# Patient Record
Sex: Male | Born: 1960 | Race: White | Hispanic: No | Marital: Married | Smoking: Never smoker
Health system: Southern US, Community
[De-identification: ages and names within clinical notes are randomized; demographics above are authoritative.]

## PROBLEM LIST (undated history)

## (undated) DIAGNOSIS — K2270 Barrett's esophagus without dysplasia: Secondary | ICD-10-CM

## (undated) DIAGNOSIS — K449 Diaphragmatic hernia without obstruction or gangrene: Secondary | ICD-10-CM

## (undated) DIAGNOSIS — E785 Hyperlipidemia, unspecified: Secondary | ICD-10-CM

## (undated) DIAGNOSIS — K219 Gastro-esophageal reflux disease without esophagitis: Secondary | ICD-10-CM

## (undated) DIAGNOSIS — K222 Esophageal obstruction: Secondary | ICD-10-CM

## (undated) DIAGNOSIS — I10 Essential (primary) hypertension: Secondary | ICD-10-CM

## (undated) DIAGNOSIS — I2510 Atherosclerotic heart disease of native coronary artery without angina pectoris: Secondary | ICD-10-CM

## (undated) DIAGNOSIS — T7840XA Allergy, unspecified, initial encounter: Secondary | ICD-10-CM

## (undated) DIAGNOSIS — K5790 Diverticulosis of intestine, part unspecified, without perforation or abscess without bleeding: Secondary | ICD-10-CM

## (undated) HISTORY — DX: Esophageal obstruction: K22.2

## (undated) HISTORY — DX: Diaphragmatic hernia without obstruction or gangrene: K44.9

## (undated) HISTORY — DX: Atherosclerotic heart disease of native coronary artery without angina pectoris: I25.10

## (undated) HISTORY — PX: COLONOSCOPY: SHX174

## (undated) HISTORY — PX: OTHER SURGICAL HISTORY: SHX169

## (undated) HISTORY — DX: Diverticulosis of intestine, part unspecified, without perforation or abscess without bleeding: K57.90

## (undated) HISTORY — PX: UPPER GASTROINTESTINAL ENDOSCOPY: SHX188

## (undated) HISTORY — DX: Gastro-esophageal reflux disease without esophagitis: K21.9

## (undated) HISTORY — DX: Hyperlipidemia, unspecified: E78.5

## (undated) HISTORY — PX: INGUINAL HERNIA REPAIR: SUR1180

## (undated) HISTORY — DX: Allergy, unspecified, initial encounter: T78.40XA

## (undated) HISTORY — DX: Essential (primary) hypertension: I10

## (undated) HISTORY — DX: Barrett's esophagus without dysplasia: K22.70

---

## 2000-03-18 ENCOUNTER — Encounter: Admission: RE | Admit: 2000-03-18 | Discharge: 2000-03-18 | Payer: Self-pay | Admitting: Family Medicine

## 2000-03-18 ENCOUNTER — Encounter (INDEPENDENT_AMBULATORY_CARE_PROVIDER_SITE_OTHER): Payer: Self-pay | Admitting: *Deleted

## 2000-03-18 ENCOUNTER — Encounter: Payer: Self-pay | Admitting: Family Medicine

## 2000-03-22 ENCOUNTER — Encounter (INDEPENDENT_AMBULATORY_CARE_PROVIDER_SITE_OTHER): Payer: Self-pay | Admitting: Specialist

## 2000-03-22 ENCOUNTER — Ambulatory Visit (HOSPITAL_COMMUNITY): Admission: RE | Admit: 2000-03-22 | Discharge: 2000-03-22 | Payer: Self-pay | Admitting: Internal Medicine

## 2000-05-11 ENCOUNTER — Encounter: Payer: Self-pay | Admitting: Internal Medicine

## 2000-05-11 ENCOUNTER — Ambulatory Visit (HOSPITAL_COMMUNITY): Admission: RE | Admit: 2000-05-11 | Discharge: 2000-05-11 | Payer: Self-pay | Admitting: Internal Medicine

## 2001-07-07 ENCOUNTER — Encounter (INDEPENDENT_AMBULATORY_CARE_PROVIDER_SITE_OTHER): Payer: Self-pay | Admitting: *Deleted

## 2001-07-07 ENCOUNTER — Ambulatory Visit (HOSPITAL_BASED_OUTPATIENT_CLINIC_OR_DEPARTMENT_OTHER): Admission: RE | Admit: 2001-07-07 | Discharge: 2001-07-07 | Payer: Self-pay | Admitting: Surgery

## 2001-10-25 ENCOUNTER — Encounter: Payer: Self-pay | Admitting: Internal Medicine

## 2002-02-27 ENCOUNTER — Ambulatory Visit (HOSPITAL_COMMUNITY): Admission: RE | Admit: 2002-02-27 | Discharge: 2002-02-27 | Payer: Self-pay | Admitting: Internal Medicine

## 2002-10-19 ENCOUNTER — Ambulatory Visit (HOSPITAL_BASED_OUTPATIENT_CLINIC_OR_DEPARTMENT_OTHER): Admission: RE | Admit: 2002-10-19 | Discharge: 2002-10-19 | Payer: Self-pay | Admitting: General Surgery

## 2002-10-19 ENCOUNTER — Encounter (INDEPENDENT_AMBULATORY_CARE_PROVIDER_SITE_OTHER): Payer: Self-pay | Admitting: Specialist

## 2002-10-26 ENCOUNTER — Encounter (INDEPENDENT_AMBULATORY_CARE_PROVIDER_SITE_OTHER): Payer: Self-pay | Admitting: *Deleted

## 2003-05-16 ENCOUNTER — Encounter: Admission: RE | Admit: 2003-05-16 | Discharge: 2003-05-16 | Payer: Self-pay | Admitting: General Surgery

## 2003-09-10 ENCOUNTER — Ambulatory Visit (HOSPITAL_COMMUNITY): Admission: RE | Admit: 2003-09-10 | Discharge: 2003-09-10 | Payer: Self-pay | Admitting: Internal Medicine

## 2003-09-10 ENCOUNTER — Encounter (INDEPENDENT_AMBULATORY_CARE_PROVIDER_SITE_OTHER): Payer: Self-pay | Admitting: Specialist

## 2005-11-30 ENCOUNTER — Ambulatory Visit: Payer: Self-pay | Admitting: Internal Medicine

## 2006-01-21 ENCOUNTER — Encounter: Payer: Self-pay | Admitting: Internal Medicine

## 2006-01-21 ENCOUNTER — Ambulatory Visit: Payer: Self-pay | Admitting: Internal Medicine

## 2007-09-19 ENCOUNTER — Encounter: Admission: RE | Admit: 2007-09-19 | Discharge: 2007-09-19 | Payer: Self-pay | Admitting: Family Medicine

## 2008-06-03 ENCOUNTER — Encounter: Admission: RE | Admit: 2008-06-03 | Discharge: 2008-06-03 | Payer: Self-pay | Admitting: Unknown Physician Specialty

## 2008-10-14 ENCOUNTER — Ambulatory Visit: Payer: Self-pay | Admitting: Internal Medicine

## 2008-10-25 ENCOUNTER — Encounter: Payer: Self-pay | Admitting: Internal Medicine

## 2008-10-25 ENCOUNTER — Ambulatory Visit: Payer: Self-pay | Admitting: Internal Medicine

## 2008-10-28 ENCOUNTER — Encounter: Payer: Self-pay | Admitting: Internal Medicine

## 2009-09-16 ENCOUNTER — Telehealth: Payer: Self-pay | Admitting: Internal Medicine

## 2009-09-18 DIAGNOSIS — K219 Gastro-esophageal reflux disease without esophagitis: Secondary | ICD-10-CM

## 2009-09-18 DIAGNOSIS — Z8719 Personal history of other diseases of the digestive system: Secondary | ICD-10-CM

## 2009-09-18 DIAGNOSIS — K449 Diaphragmatic hernia without obstruction or gangrene: Secondary | ICD-10-CM | POA: Insufficient documentation

## 2009-09-18 DIAGNOSIS — K222 Esophageal obstruction: Secondary | ICD-10-CM

## 2009-09-23 ENCOUNTER — Telehealth: Payer: Self-pay | Admitting: Internal Medicine

## 2009-09-23 ENCOUNTER — Ambulatory Visit: Payer: Self-pay | Admitting: Internal Medicine

## 2009-09-23 DIAGNOSIS — E785 Hyperlipidemia, unspecified: Secondary | ICD-10-CM

## 2009-09-23 DIAGNOSIS — I10 Essential (primary) hypertension: Secondary | ICD-10-CM | POA: Insufficient documentation

## 2009-09-23 DIAGNOSIS — R198 Other specified symptoms and signs involving the digestive system and abdomen: Secondary | ICD-10-CM

## 2009-10-06 ENCOUNTER — Ambulatory Visit: Payer: Self-pay | Admitting: Internal Medicine

## 2009-11-19 ENCOUNTER — Telehealth: Payer: Self-pay | Admitting: Internal Medicine

## 2010-08-04 NOTE — Progress Notes (Signed)
Summary: medication  Phone Note Call from Patient Call back at Home Phone [phone]   Caller: Patient Call For: Dr. Juanda Chance Reason for Call: Refill Medication Summary of Call: Pt went to pharmacy and needs his Cipro and Bentyl called in Initial call taken by: Karna Christmas,  September 23, 2009 12:34 PM    New/Updated Medications: CIPRO 250 MG TABS (CIPROFLOXACIN HCL) Take 1 tablet by mouth two times a day x 5 days BENTYL 10 MG CAPS (DICYCLOMINE HCL) Take 1 tablet by mouth two times a day Prescriptions: BENTYL 10 MG CAPS (DICYCLOMINE HCL) Take 1 tablet by mouth two times a day  #30 x 0   Entered by:   Hortense Ramal CMA (AAMA)   Authorized by:   Hart Carwin MD   Signed by:   Hortense Ramal CMA (AAMA) on 09/23/2009   Method used:   Electronically to        [facility] (retail)       [address]       [place], [place]  [address]       Ph: [phone]       Fax: [phone]   RxID:   [id number] CIPRO 250 MG TABS (CIPROFLOXACIN HCL) Take 1 tablet by mouth two times a day x 5 days  #10 x 0   Entered by:   Hortense Ramal CMA (AAMA)   Authorized by:   Hart Carwin MD   Signed by:   Hortense Ramal CMA (AAMA) on 09/23/2009   Method used:   Electronically to        [facility] (retail)       [address]       [place], [place]  [address]       Ph: [phone]       Fax: [phone]   RxID:   [id number]

## 2010-08-04 NOTE — Progress Notes (Signed)
Summary: Triage  Phone Note Call from Patient Call back at Work Phone [phone]   Caller: Patient Call For: Dr. Juanda Chance Reason for Call: Talk to Nurse Summary of Call: pt is having alot of "gas" in the mornings and espicially after he has ate.  Initial call taken by: Karna Christmas,  September 16, 2009 9:41 AM  Follow-up for Phone Call        Pt. c/o 3 weeks of epigastric bloating/gas and grumbling, worse in the morning and after meals.  Takes Prevacid daily.  Denies constipation, diarrhea, rectal bleeding, black stools. Wants an appt. w/Dr.Clotiel Troop.  1) See Dr.Jerrik Housholder on 09-23-09 at 9am 2) Increase Prevacid to two times a day until OV 3) Gas-x,Phazyme, etc. as needed for gas & bloating. 4) Soft,bland diet. No spicy,greasy,fried foods.  5) Pt. instructed to call back as needed.  Follow-up by: Laureen Ochs LPN,  September 16, 2009 11:01 AM

## 2010-08-04 NOTE — Progress Notes (Signed)
Summary: Medication refill  Phone Note Call from Patient Call back at Home Phone [phone]   Caller: Patient Call For: Dr. Juanda Chance Reason for Call: Talk to Nurse Summary of Call: Needs refill of Bentyl.[employer]land [employer]land [employer]. Initial call taken by: Karna Christmas,  Nov 19, 2009 10:11 AM  Follow-up for Phone Call        prescription sent. Follow-up by: Lamona Curl CMA Duncan Dull),  Nov 19, 2009 12:11 PM    Prescriptions: BENTYL 10 MG CAPS (DICYCLOMINE HCL) Take 1 tablet by mouth two times a day  #60 x 3   Entered by:   Lamona Curl CMA (AAMA)   Authorized by:   Hart Carwin MD   Signed by:   Lamona Curl CMA (AAMA) on 11/19/2009   Method used:   Electronically to        [facility] (retail)       [address]       [place], [place]  [address]       Ph: [phone]       Fax: [phone]   RxID:   [id number]

## 2010-08-04 NOTE — Letter (Signed)
Summary: [facility] Instructions  [facility]  [address] [place], [place] [address]   Phone: [phone]  Fax: [phone]       KULLEN TOMASETTI    11/26/1960    MRN: [id number]       Procedure Day /Date: 10/06/09 Monday     Arrival Time: 1:00 pm     Procedure Time: 2:00 pm     Location of Procedure:                    _x _  [facility] (4th Floor)  PREPARATION FOR COLONOSCOPY WITH MIRALAX  Starting 5 days prior to your procedure 10/01/09 do not eat nuts, seeds, popcorn, corn, beans, peas,  salads, or any raw vegetables.  Do not take any fiber supplements (e.g. Metamucil, Citrucel, and Benefiber). ____________________________________________________________________________________________________   THE DAY BEFORE YOUR PROCEDURE         DATE: 10/05/09 DAY: Sunday  1   Drink clear liquids the entire day-NO SOLID FOOD  2   Do not drink anything colored red or purple.  Avoid juices with pulp.  No orange juice.  3   Drink at least 64 oz. (8 glasses) of fluid/clear liquids during the day to prevent dehydration and help the prep work efficiently.  CLEAR LIQUIDS INCLUDE: Water Jello Ice Popsicles Tea (sugar ok, no milk/cream) Powdered fruit flavored drinks Coffee (sugar ok, no milk/cream) Gatorade Juice: apple, white grape, white cranberry  Lemonade Clear bullion, consomm, broth Carbonated beverages (any kind) Strained chicken noodle soup Hard Candy  4   Mix the entire bottle of Miralax with 64 oz. of Gatorade/Powerade in the morning and put in the refrigerator to chill.  5   At 3:00 pm take 2 Dulcolax/Bisacodyl tablets.  6   At 4:30 pm take one Reglan/Metoclopramide tablet.  7  Starting at 5:00 pm drink one 8 oz glass of the Miralax mixture every 15-20 minutes until you have finished drinking the entire 64 oz.  You should finish drinking prep around 7:30 or 8:00 pm.  8   If you are nauseated, you may take the 2nd Reglan/Metoclopramide tablet at  6:30 pm.        9    At 8:00 pm take 2 more DULCOLAX/Bisacodyl tablets.        THE DAY OF YOUR PROCEDURE      DATE:  10/06/09 DAY: Monday  You may drink clear liquids until 12:00 pm  (2 HOURS BEFORE PROCEDURE).   MEDICATION INSTRUCTIONS  Unless otherwise instructed, you should take regular prescription medications with a small sip of water as early as possible the morning of your procedure.       OTHER INSTRUCTIONS  You will need a responsible adult at least 48 years of age to accompany you and drive you home.   This person must remain in the waiting room during your procedure.  Wear loose fitting clothing that is easily removed.  Leave jewelry and other valuables at home.  However, you may wish to bring a book to read or an iPod/MP3 player to listen to music as you wait for your procedure to start.  Remove all body piercing jewelry and leave at home.  Total time from sign-in until discharge is approximately 2-3 hours.  You should go home directly after your procedure and rest.  You can resume normal activities the day after your procedure.  The day of your procedure you should not:   Drive   Make legal  decisions   Operate machinery   Drink alcohol   Return to work  You will receive specific instructions about eating, activities and medications before you leave.   The above instructions have been reviewed and explained to me by  Hortense Ramal CMA Duncan Dull)  September 23, 2009 10:05 AM     I fully understand and can verbalize these instructions _____________________________ Date 09/23/09

## 2010-08-04 NOTE — Procedures (Signed)
Summary: Colonoscopy  Patient: [name] Note: All result statuses are Final unless otherwise noted.  Tests: (1) Colonoscopy (COL)   COL Colonoscopy           DONE     [facility]     [address]     [place], [place]  [address]           COLONOSCOPY PROCEDURE REPORT           PATIENT:  Gabriel Franklin, Gabriel Franklin  MR#:  [id number]     BIRTHDATE:  1961-07-03, 48 yrs. old  GENDER:  male     ENDOSCOPIST:  Hedwig Morton. Juanda Chance, MD     REF. BY:  Bradd Canary, M.D.     PROCEDURE DATE:  10/06/2009     PROCEDURE:  Colonoscopy 19[address]     ASA CLASS:  Class I     INDICATIONS:  mother with suspected colon cancer     Barrett's esophagus     MEDICATIONS:   Versed 10 mg, Fentanyl 100 mcg           DESCRIPTION OF PROCEDURE:   After the risks benefits and     alternatives of the procedure were thoroughly explained, informed     consent was obtained.  Digital rectal exam was performed and     revealed no rectal masses.   The LB PCF-H180AL [id number] endoscope     was introduced through the anus and advanced to the cecum, which     was identified by both the appendix and ileocecal valve, without     limitations.  The quality of the prep was good, using MoviPrep.     The instrument was then slowly withdrawn as the colon was fully     examined.     <<PROCEDUREIMAGES>>           FINDINGS:  Mild diverticulosis was found (see image1). several     shallow diverticuli  This was otherwise a normal examination of     the colon (see image2, image3, image4, image5, and image6).     Retroflexed views in the rectum revealed no abnormalities.    The     scope was then withdrawn from the patient and the procedure     completed.           COMPLICATIONS:  None     ENDOSCOPIC IMPRESSION:     1) Mild diverticulosis     2) Otherwise normal examination     RECOMMENDATIONS:     1) high fiber diet     REPEAT EXAM:  In 5 - 7 year(s) for.           ______________________________     Hedwig Morton. Juanda Chance, MD           CC:          n.     eSIGNED:   Hedwig Morton. Brodie at 10/06/2009 02:43 PM           Creta Levin, [id number]  Note: An exclamation mark (!) indicates a result that was not dispersed into the flowsheet. Document Creation Date: 10/06/2009 2:43 PM _______________________________________________________________________  (1) Order result status: Final Collection or observation date-time: 10/06/2009 14:35 Requested date-time:  Receipt date-time:  Reported date-time:  Referring Physician:   Ordering Physician: Lina Sar [phone]) Specimen Source:  Source: Launa Grill Order Number: [id number] Lab site:   Appended Document: Colonoscopy    Clinical Lists Changes  Observations: Added new observation of COLONNXTDUE: 10/2014 (10/06/2009  14:45)     

## 2010-08-04 NOTE — Assessment & Plan Note (Signed)
Summary: Gastroenterology  [place]  MR#:  [id number] Page #  [facility]   GASTROENTEROLOGY OFFICE NOTE  NAME:  Gabriel Franklin, Gabriel Franklin   OFFICE NO:  [id number]  DATE:  10/25/01  SUBJECTIVE:  The patient is a very nice 50 year old white male with a history of Barrett's esophagus, diagnosed on upper endoscopy initially in September 2001.  Subsequently in November 2001, he was dilated to 79 [place] with Savary dilator.  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  The patient denies currently any dysphagia, odynophagia or any difficulties with swallowing.  He had one episode of burning about three weeks ago.  He takes Prilosec 20 mg every morning.  His weight has been stable.  He is due for upper endoscopy in November of this year.  The patient comes in to discuss his Barrett's esophagus.  He was curious to know how often he needs to be endoscoped, what is the prognosis, incidence of cancer, and medical versus surgical therapy.  DISPOSITION:  Since he is doing very well at this point, he does not need to be endoscoped.  However, he is due for repeat endoscopy in November, which would be two years, then every two years thereafter.  I have advised him to stay on PPIs indefinitely and to take an extra one if he has breakthrough symptoms.  He is switching from Prilosec to Nexium 40 mg once a day to twice a day and was given a new prescription for this.  I advised him not to exceed three cups of coffee a day, to elevate the head of the bed at night and to follow anti-reflux measures.  I would not advise Nissen fundoplication because of the likelihood that the patient has a secondary dysmotility disorder of the esophagus due to a long mid esophageal stricture.     Hedwig Morton. Juanda Chance, M.D.  [id number] D:  10/25/01; T:  ; Job [id number]

## 2010-08-04 NOTE — Assessment & Plan Note (Signed)
Summary: EPIGASTRIC BLOATING/GAS, GETTING WORSE            Gabriel Franklin   History of Present Illness Visit Type: new patient  Primary GI MD: Lina Sar MD Primary Provider: Bradd Canary, MD Requesting Provider: n/a Chief Complaint: After meals pt states that he gets lower abd pain and bloating  History of Present Illness:   This is a 50 year old white male with a several week history of crampy abdominal pain bloating and flatulence. His bowel habits have changed from one bowel movement a day to somewhat looser and more frequent stools. He denies any change in his eating habits or his regular schedule. He has never had a colonoscopy. His mother passed away  4 years ago and apparently had an elevated CEA level but never underwent a colonoscopy. Patient was told that she may have colon cancer. He is on Prevacid 30 mg once a day for Barrett's esophagus. His last upper endoscopy in April 2010 did not show any evidence of Barrett's esophagus.   GI Review of Systems    Reports abdominal pain and  bloating.     Location of  Abdominal pain: lower abdomen.    Denies acid reflux, belching, chest pain, dysphagia with liquids, dysphagia with solids, heartburn, loss of appetite, nausea, vomiting, vomiting blood, weight loss, and  weight gain.      Reports hemorrhoids.     Denies anal fissure, black tarry stools, change in bowel habit, constipation, diarrhea, diverticulosis, fecal incontinence, heme positive stool, irritable bowel syndrome, jaundice, light color stool, liver problems, rectal bleeding, and  rectal pain.    Current Medications (verified): 1)  Micardis Hct 40-12.5 Mg Tabs (Telmisartan-Hctz) .... One Tablet By Mouth Once Daily 2)  Prevacid 30 Mg Cpdr (Lansoprazole) .... One Tablet By Mouth Once Daily 3)  Crestor 10 Mg Tabs (Rosuvastatin Calcium) .... One Tablet By Mouth Once Daily 4)  Proctofoam Hc 1-1 % Foam (Hydrocortisone Ace-Pramoxine) .... As Directed  Allergies (verified): 1)  ! Pcn 2)  !  Darvon  Past History:  Past Medical History: Current Problems:  HYPERTENSION (ICD-401.9) HYPERLIPIDEMIA (ICD-272.4) GERD (ICD-530.81) HIATAL HERNIA (ICD-553.3) Hx of ESOPHAGEAL STRICTURE (ICD-530.3) BARRETT'S ESOPHAGUS, HX OF (ICD-V12.79)  Past Surgical History: Reviewed history from 09/18/2009 and no changes required. Bilateral Inguinal Hernia Repair  Family History: Family History of Prostate Cancer: Father  Family History of Colon Cancer: ? Mother   Social History: Occupation: Charity fundraiser Married 2 childern Patient has never smoked.  Alcohol Use - yes: one daily  Daily Caffeine Use: two daily  Illicit Drug Use - no Smoking Status:  never  Review of Systems       The patient complains of allergy/sinus, anxiety-new, and headaches-new.  The patient denies anemia, arthritis/joint pain, back pain, blood in urine, breast changes/lumps, change in vision, confusion, cough, coughing up blood, depression-new, fainting, fatigue, fever, hearing problems, heart murmur, heart rhythm changes, itching, muscle pains/cramps, night sweats, nosebleeds, shortness of breath, skin rash, sleeping problems, sore throat, swelling of feet/legs, swollen lymph glands, thirst - excessive, urination - excessive, urination changes/pain, urine leakage, vision changes, and voice change.         Pertinent positive and negative review of systems were noted in the above HPI. All other ROS was otherwise negative.   Vital Signs:  Patient profile:   50 year old male Height:      73 inches Weight:      195 pounds BMI:     25.82 BSA:  2.13 Pulse rate:   76 / minute Pulse rhythm:   regular BP sitting:   132 / 80  (left arm) Cuff size:   regular  Vitals Entered By: Ok Anis CMA (September 23, 2009 9:11 AM)  Physical Exam  General:  Well developed, well nourished, no acute distress. Eyes:  PERRLA, no icterus. Mouth:  No deformity or lesions, dentition normal. Neck:  Supple; no masses or  thyromegaly. Lungs:  Clear throughout to auscultation. Heart:  Regular rate and rhythm; no murmurs, rubs,  or bruits. Abdomen:  soft abdomen with normoactive bowel sounds. No distention. No palpable mass. Liver edge at costal margin. Rectal:  soft Hemoccult negative stool. Extremities:  No clubbing, cyanosis, edema or deformities noted. Skin:  Intact without significant lesions or rashes. Psych:  Alert and cooperative. Normal mood and affect.   Impression & Recommendations:  Problem # 1:  BARRETT'S ESOPHAGUS, HX OF (ICD-V12.79) Patient is up-to-date on his upper endoscopy and currently has no symptoms.  Problem # 2:  CHANGE IN BOWELS (ICD-787.99)  We need to rule out bacterial overgrowth. I doubt we are dealing with diverticulitis since his abdominal exam is normal today. We should also rule out postinfectious irritable bowel syndrome. He does have a possible family history of colon cancer.  Orders: Colonoscopy (Colon)  Patient Instructions: 1)  Please come for your scheduled colonoscopy on 10/06/09. 2)  Please pick up Cipro 250 two times a day x 5 day. A prescription has been sent to your pharmacy. 3)  Please pick up Bentyl 10 mg two times a day. A new prescription has been sent to the pharmacy. 4)  Copy sent to : Bradd Canary, MD 5)  The medication list was reviewed and reconciled.  All changed / newly prescribed medications were explained.  A complete medication list was provided to the patient / caregiver. Prescriptions: DULCOLAX 5 MG  TBEC (BISACODYL) Day before procedure take 2 at 3pm and 2 at 8pm.  #4 x 0   Entered by:   Hortense Ramal CMA (AAMA)   Authorized by:   Hart Carwin MD   Signed by:   Hortense Ramal CMA (AAMA) on 09/23/2009   Method used:   Electronically to        [facility] ([facility]ail)       [address]       [place], [place]  [address]       Ph: [phone]       Fax: [phone]   RxID:   [id number] REGLAN 10  MG  TABS (METOCLOPRAMIDE HCL) As per prep instructions.  #2 x 0   Entered by:   Hortense Ramal CMA (AAMA)   Authorized by:   Hart Carwin MD   Signed by:   Hortense Ramal CMA (AAMA) on 09/23/2009   Method used:   Electronically to        [facility] (retail)       [address]       [place], [place]  [address]       Ph: [phone]       Fax: [phone]   RxID:   [id number] MIRALAX   POWD (POLYETHYLENE GLYCOL 3350) As per prep  instructions.  #255gm x 0   Entered by:   Hortense Ramal CMA (AAMA)   Authorized by:   Hedwig Morton  Juanda Chance MD   Signed by:   Hortense Ramal CMA (AAMA) on 09/23/2009   Method used:   Electronically to        [facility] [address] [address] (retail)       [address]       [place], [place]  [address]       Ph: [phone]       Fax: [phone]   RxID:   [id number]

## 2010-08-04 NOTE — Procedures (Signed)
Summary: Colonoscopy  Patient: Gabriel Franklin Note: All result statuses are Final unless otherwise noted.  Tests: (1) Colonoscopy (COL)   COL Colonoscopy           DONE (C)     [facility]     [address]     [place], [place]  [address]           COLONOSCOPY PROCEDURE REPORT           PATIENT:  [name], [name]  MR#:  [id number]     BIRTHDATE:  03-04-1961, 48 yrs. old  GENDER:  male     ENDOSCOPIST:  Hedwig Morton. Juanda Chance, MD     REF. BY:  Bradd Canary, M.D.     PROCEDURE DATE:  10/06/2009     PROCEDURE:  Colonoscopy [address]     ASA CLASS:  Class I     INDICATIONS:  mother with suspected colon cancer     Barrett's esophagus     MEDICATIONS:   Versed 10 mg, Fentanyl 100 mcg, Benadryl 25 mg IV           DESCRIPTION OF PROCEDURE:   After the risks benefits and     alternatives of the procedure were thoroughly explained, informed     consent was obtained.  Digital rectal exam was performed and     revealed no rectal masses.   The LB PCF-H180AL [id number] endoscope     was introduced through the anus and advanced to the cecum, which     was identified by both the appendix and ileocecal valve, without     limitations.  The quality of the prep was good, using MoviPrep.     The instrument was then slowly withdrawn as the colon was fully     examined.     <<PROCEDUREIMAGES>>           FINDINGS:  Mild diverticulosis was found (see image1). several     shallow diverticuli  This was otherwise a normal examination of     the colon (see image2, image3, image4, image5, and image6).     Retroflexed views in the rectum revealed no abnormalities.    The     scope was then withdrawn from the patient and the procedure     completed.           COMPLICATIONS:  None     ENDOSCOPIC IMPRESSION:     1) Mild diverticulosis     2) Otherwise normal examination     RECOMMENDATIONS:     1) high fiber diet     REPEAT EXAM:  In 5 - 7 year(s) for.           ______________________________     Hedwig Morton.  Juanda Chance, MD           CC:           n.     REVISED:  10/14/2009 05:44 PM     eSIGNED:   Hedwig Morton. Glendene Wyer at 10/14/2009 05:44 PM           Creta Levin, [id number]  Note: An exclamation mark (!) indicates a result that was not dispersed into the flowsheet. Document Creation Date: 10/14/2009 5:45 PM _______________________________________________________________________  (1) Order result status: Final Collection or observation date-time: 10/06/2009 14:35 Requested date-time:  Receipt date-time:  Reported date-time:  Referring Physician:   Ordering Physician: Lina Sar [phone]) Specimen Source:  Source: Launa Grill Order Number: [id number] Lab site:

## 2010-10-20 ENCOUNTER — Other Ambulatory Visit: Payer: BC Managed Care – PPO

## 2010-10-20 ENCOUNTER — Other Ambulatory Visit: Payer: Self-pay | Admitting: Internal Medicine

## 2010-10-20 ENCOUNTER — Ambulatory Visit (INDEPENDENT_AMBULATORY_CARE_PROVIDER_SITE_OTHER): Payer: BC Managed Care – PPO | Admitting: Internal Medicine

## 2010-10-20 ENCOUNTER — Encounter: Payer: Self-pay | Admitting: Internal Medicine

## 2010-10-20 VITALS — BP 132/80 | HR 74 | Ht 73.0 in | Wt 194.0 lb

## 2010-10-20 DIAGNOSIS — Z800 Family history of malignant neoplasm of digestive organs: Secondary | ICD-10-CM

## 2010-10-20 DIAGNOSIS — K2270 Barrett's esophagus without dysplasia: Secondary | ICD-10-CM

## 2010-10-20 DIAGNOSIS — R1013 Epigastric pain: Secondary | ICD-10-CM

## 2010-10-20 DIAGNOSIS — K3189 Other diseases of stomach and duodenum: Secondary | ICD-10-CM

## 2010-10-20 MED ORDER — ALIGN PO CAPS: 1.0000 | ORAL_CAPSULE | Freq: Every day | ORAL | Status: AC

## 2010-10-20 NOTE — Progress Notes (Signed)
Gabriel Franklin July 01, 1961 MRN [id number]     History of Present Illness:  This is a 50 year old white male with a history of benign esophageal stricture and gastroesophageal reflux. He has Barrett's esophagus. His last upper endoscopy was in April 2010. Prior upper endoscopies were completed in 2001, 2003, 2005, and 2007. He was dilated with  Savary dilators 15 and 17 mm. He is due for a recall endoscopy in April 2013. He is here today because of symptoms of bloating occurring within half an hour of meals lasting about an hour and half. This occurs usually after an evening meal. We have seen him for the same complaints about a year ago at which case we thought he had irritable bowel syndrome. He underwent a colonoscopy in April 2011 for a family history of colon cancer in his mother. He had a few diverticuli. He continues to have some gas and bloating postprandially. There is no family history of gallbladder disease. His weight has been stable. He has tried probiotics without success. He has tried Bentyl 10 mg twice a day without improvement.   Past Medical History  Diagnosis Date  . Hypertension   . Hyperlipidemia   . GERD (gastroesophageal reflux disease)   . Hiatal hernia   . Barrett's esophagus   . Esophageal stricture   . Diverticulosis    Past Surgical History  Procedure Date  . Inguinal hernia repair     bilateral    reports that he has never smoked. He does not have any smokeless tobacco history on file. He reports that he drinks alcohol. He reports that he does not use illicit drugs. family history includes Colon cancer in his mother and Prostate cancer in his father. Allergies  Allergen Reactions  . Penicillins     REACTION: rash  . Propoxyphene Hcl     REACTION: rash        Review of Systems: Denies chest pain, nausea vomiting, denies rectal bleeding, shortness of breath.  The remainder of the 10  point ROS is negative except as outlined in H&P   Physical  Exam: General appearance  Well developed, in no distress. Eyes- non icteric. HEENT nontraumatic, normocephalic. Mouth no lesions, tongue papillated, no cheilosis. Neck supple without adenopathy, thyroid not enlarged, no carotid bruits, no JVD. Lungs Clear to auscultation bilaterally. Cor normal S1 normal S2, regular rhythm , no murmur,  quiet precordium. Abdomen soft nontender abdomen without distention. Normal active bowel sounds. No tympany. No focal tenderness or fullness. His liver edge is at the costal margin. Extremities no pedal edema. Skin no lesions. Neurological alert and oriented x 3. Psychological normal mood and affect.  Assessment and Plan:  Problems #1 bloating, gas, dyspepsia. This is likely related to irritable bowel syndrome. He should continue Bentyl 10 mg twice a day and add fiber to his regimen. He may also use over-the-counter Gaviscon or Phazyme. We will proceed with an upper abdominal ultrasound to rule out symptomatic gallbladder disease. We will also check a sprue panel. I have advised a low-fat diet with smaller evening meals.  Problem #2 Barrett's esophagus. Patient is on pantoprazole 40 mg daily. It is currently under good control. His next upper endoscopy will be due in April 2013.   10/20/2010 Gabriel Franklin

## 2010-10-20 NOTE — Patient Instructions (Addendum)
You have been scheduled for an abdominal ultrasound at [facility] on Friday 10/23/10 @ 9:30 am. Please arrive at 9:15 am for registration. Make certain to have nothing to eat or drink 6 hours prior to your test. Your physician has requested that you go to the basement for the following lab work before leaving today: Celiac Panel, IgA, TtG, Endomysial antibody. We have given you samples of Align. This puts good bacteria back into your intestines. You should take 1 capsule by mouth once daily. If this works well for you, it can be purchased over the counter. Please get gaviscon or phazyme over the counter to chew or swallow after meals. This should help with some of your gas and bloating.

## 2010-10-21 LAB — TISSUE TRANSGLUTAMINASE, IGA: Tissue Transglutaminase Ab, IgA: 5.1 U/mL (ref <20)

## 2010-10-22 ENCOUNTER — Telehealth: Payer: Self-pay | Admitting: *Deleted

## 2010-10-22 NOTE — Telephone Encounter (Signed)
Message copied by Jesse Fall on Thu Oct 22, 2010 10:56 AM ------      Message from: Lina Sar      Created: Wed Oct 21, 2010  5:56 PM       Please call pt with negative blood test for celiac disease. No evidence for wheat allergy.

## 2010-10-22 NOTE — Telephone Encounter (Signed)
Left message to call me back.

## 2010-10-23 ENCOUNTER — Telehealth: Payer: Self-pay | Admitting: *Deleted

## 2010-10-23 ENCOUNTER — Ambulatory Visit (HOSPITAL_COMMUNITY)
Admission: RE | Admit: 2010-10-23 | Discharge: 2010-10-23 | Disposition: A | Discharge status: Home / Self Care | Payer: BC Managed Care – PPO | Source: Ambulatory Visit | Attending: Internal Medicine | Admitting: Internal Medicine

## 2010-10-23 DIAGNOSIS — R1013 Epigastric pain: Secondary | ICD-10-CM

## 2010-10-23 DIAGNOSIS — R141 Gas pain: Secondary | ICD-10-CM | POA: Insufficient documentation

## 2010-10-23 DIAGNOSIS — R143 Flatulence: Secondary | ICD-10-CM | POA: Insufficient documentation

## 2010-10-23 DIAGNOSIS — K3189 Other diseases of stomach and duodenum: Secondary | ICD-10-CM | POA: Insufficient documentation

## 2010-10-23 DIAGNOSIS — R142 Eructation: Secondary | ICD-10-CM | POA: Insufficient documentation

## 2010-10-23 NOTE — Progress Notes (Signed)
Please call pt with normal uuper abd sono o fhte gall bladder, pancreas partially vis due to overlying gas.

## 2010-10-23 NOTE — Telephone Encounter (Signed)
Patient given results as per Dr. Brodie 

## 2010-10-23 NOTE — Telephone Encounter (Signed)
Message copied by Jesse Fall on Fri Oct 23, 2010  2:56 PM ------      Message from: Lina Sar      Created: Fri Oct 23, 2010 12:29 PM             Please call pt with normal results.

## 2010-10-23 NOTE — Telephone Encounter (Signed)
Opened in error

## 2010-10-23 NOTE — Telephone Encounter (Signed)
Patient called back and was given lab results as per Dr. Juanda Chance.

## 2010-11-20 NOTE — Op Note (Signed)
NAME:  Gabriel Franklin, Gabriel Franklin                          ACCOUNT NO.:  [id number]   MEDICAL RECORD NO.:  [id number]                   PATIENT TYPE:  AMB   LOCATION:  DSC                                  FACILITY:  [facility]   PHYSICIAN:  Ollen Gross. Vernell Morgans, M.D.              DATE OF BIRTH:  12/07/1960   DATE OF PROCEDURE:  10/26/2002  DATE OF DISCHARGE:  10/19/2002                                 OPERATIVE REPORT   PREOPERATIVE DIAGNOSES:  Right inguinal hernia.   POSTOPERATIVE DIAGNOSES:  Right indirect inguinal hernia.   OPERATION PERFORMED:  Right inguinal hernia repair with mesh.   SURGEON:  Ollen Gross. Carolynne Edouard, M.D.   ANESTHESIA:  General.   DESCRIPTION OF PROCEDURE:  After informed consent was obtained, the patient  was brought to the operating room and placed in supine position on the  operating table.  After adequate induction of general anesthesia, the  patient's right groin and abdomen area was prepped with Betadine and draped  in the usual sterile manner.  An incision was made from the edge of the  pubic tubercle on the right towards the anterior iliac spine for a distance  of about 5 cm.  This incision was carried down through the skin and  subcutaneous tissue using electrocautery.  A small bridging vein in the  subcutaneous tissue was clamped with hemostats, divided and ligated with 0  silk ties.  Dissection was carried down until the fascia of the external  oblique was encountered.  The external oblique was opened along its fibers  using a 15 blade knife and Metzenbaum scissors through the apex of the  external ring.  The ilioinguinal nerve was identified with the cord and care  was taken to avoid this nerve.  Blunt finger dissection was carried out at  the edge of the pubic tubercle until two fingers could surround the cord  structures.  A half-inch Penrose drain was then wrapped around the cord  structures for retraction purposes.  Blunt skeletonization of the cord  structures was  then carried out and a hernia sac was able to be identified  and separated from the rest of the cord structures taking care not to damage  the vessels or the vas deferens.  The hernia sac was then opened there were  no viscera within the sac.  The sac was then ligated at its base with 2-0  silk suture ligature and the excess sac was excised and sent to pathology.  The stump of the sac was then allowed to retract back within the abdomen.  The floor of the inguinal canal was then reinforced with a piece of 3 x 6  Atrium mesh.  Tails were cut in the mesh to wrap around the cord structures.  The mesh was sewed inferiorly with a running 2-0 Prolene stitch to the  inferior shelving edge of the inguinal ligament.  The mesh was  then tacked  with interrupted vertical mattress sutures to the musculoaponeurotic fascial  layer of the transversalis.  The tails of the mesh were wrapped around the  cord structures and then again anchored laterally to the shelving edge of  the inguinal ligament.  When this was accomplished, the mesh was in good  position without any tension.  The wound was then irrigated with saline.  The external oblique was then reapproximated using a running 3-0 Vicryl  stitch, the subcutaneous fascia was reapproximated with interrupted 3-0  Vicryl stitches.  The wound was then infiltrated with 0.25% Marcaine and the  skin was closed with a  running 4-0 Monocryl subcuticular stitch.  Benzoin, Steri-Strips and sterile  dressings were applied.  The patient tolerated the procedure well.  At the  end of the case all sponge, needle and instrument counts were correct.  The  patient was awakened and taken to the recovery room in stable condition.                                                  Ollen Gross. Vernell Morgans, M.D.    PST/MEDQ  D:  10/26/2002  T:  10/29/2002  Job:  [id number]

## 2010-11-20 NOTE — Op Note (Signed)
   TNAMEJANSSEN, ZEE                         ACCOUNT NO.:  [id number]   MEDICAL RECORD NO.:  [id number]                   PATIENT TYPE:  AMB   LOCATION:  ENDO                                 FACILITY:  [facility]   PHYSICIAN:  Hedwig Morton. Juanda Chance, M.D. [facility]            DATE OF BIRTH:  Apr 14, 1961   DATE OF PROCEDURE:  DATE OF DISCHARGE:                                 OPERATIVE REPORT   NAME OF PROCEDURE:  Upper endoscopy, esophageal dilation.   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  He has had no trouble swallowing until about two months ago when he started  having about every two to three days problems with dysphagia, mostly solid  food.  He is now undergoing endoscopy with dilatation.   ENDOSCOPE:  Olympus single channel video endoscope.   SEDATION:  Versed 12.5 mg IV, Demerol 100 mg IV.   FINDINGS:  Olympus single channel video endoscope passed into the __________  esophagus.  __________ pulse oximetry.  His oxygen saturation was normal.  He required a large amount of sedation.  The vallecula, piriform, sinus, and  glottis were normal.  Proximal esophagus was normal.  Starting at the level  of 20 cm from the incisors was a smooth concentric stricture extending all  the way to 40 cm from the incisors.  Endoscope passed through the stricture  with some resistance indicating the diameter of the stricture being less  than 12 mm.   The stomach was insufflated with air and showed normal gastric mucosa,  pyloric outlet, and duodenum.  A guidewire was then placed into the stomach  and several dilators passed over the guidewire without telescoping guidance  using 12 mm, 12.8 mm, and 14 mm dilators.  There was a considerable amount  of blood on the last dilator which passed with some resistance.  At that  point  procedure was terminated.   IMPRESSION:  Long benign esophageal stricture status post dilatation to 14  mm which is 45 [place].   PLAN:  1. Continue Prilosec 20 mg q.d.  2. __________.  3. Repeat esophageal dilation when dysphagia recurs.                                               Hedwig Morton. Juanda Chance, M.D. [facility]    DMB/MEDQ  D:  02/27/2002  T:  02/27/2002  Job:  [id number]

## 2010-11-20 NOTE — Op Note (Signed)
[place]  Patient:    Gabriel Franklin, Gabriel Franklin Visit Number: [id number] MRN: [id number]          Service Type: DSU Location: [facility] Attending Physician:  Caleen Essex Dictated by:   Ollen Gross. Vernell Morgans, M.D. Proc. Date: 07/07/01 Admit Date:  07/07/2001 Discharge Date: 07/07/2001                             Operative Report  PREOPERATIVE DIAGNOSIS:  Left inguinal hernia.  POSTOPERATIVE DIAGNOSIS:  Left inguinal hernia.  PROCEDURE:  Left inguinal hernia repair with mesh.  SURGEON:  Ollen Gross. Vernell Morgans, M.D.  ANESTHESIA:  General via LMA.  DESCRIPTION OF PROCEDURE:  After informed consent was obtained, the patient was brought to the operating room and placed in the supine position on the operating room table.  After adequate induction of general anesthesia, the patients left abdomen and groin area were prepped with Betadine and draped in usual sterile manner.  About an approximately 5 to 6 cm incision was made from the pubic tubercle along the line towards the anterior superior lytic spine. This incision was carried down through the skin and subcutaneous tissues, and the Bovie electrocautery until the external oblique fascia was encountered. The fascia was incised along its fibers with a 15 blade knife.  A pair of Metzenbaum scissors were inserted underneath the fascia to make sure there were no adhesions to the undersurface.  The fascia was opened along its fibers to the apex of the external ring.  Care was taken to retract the nerve out of the way, and the spermatic cord was surrounded between fingers.  Bluntly at the pubic tubercle a 0.25 inch Penrose drain was placed around the spermatic cord for retraction purposes.  The spermatic cord was then explored bluntly, and the hernia sac was located.  The hernia sac was separated bluntly from the rest of the spermatic cord structures.  Care was taken to keep the vas away from the dissection.  Once this was  done, the hernia sac was opened sharply with the Metzenbaum scissors.  There were no viscera within the sac.  The sac was then ligated down near its base with a 2-0 silk suture ligature, and the sac was excised and removed, and the stump was allowed to retract back to the abdomen.  The sac was sent for pathologic evaluation.  The floor of the inguinal canal was then reinforced with a piece of 3 x 6 anterior mesh which was cut to fit.  The mesh was then sewn inferiorly with a 2-0 Prolene running stitch to the shelving edge of the inguinal ligament, and tacked superiorly in multiple places to the muscular aponeurotic transversalis layer.  The tales were fashioned and wrapped around the spermatic cord, and tucked laterally under the external oblique fascia.  Once this was complete, the wound was hemostatic, and the repair was strong and intact, the external oblique was then reapproximated with a running 2-0 Prolene stitch.  The subcutaneous fascia was closed with interrupted 3-0 Vicryl sutures, and the skin was closed with a 4-0 Monocryl subcuticular stitch.  The wound was infiltrated with 0.25% Marcaine, and benzoin and Steri-Strips and sterile dressings applied.  The patient tolerated the procedure well.  At the end of the case all sponge, needle, and instrument counts were correct.  The patient was awakened and taken to the recovery room in stable condition.Dictated by:   Renae Fickle  Lovette Cliche, M.D. Attending Physician:  Caleen Essex DD:  07/24/01 TD:  07/25/01 Job: [id number]

## 2010-11-20 NOTE — Procedures (Signed)
[facility]  Patient:    Gabriel Franklin, Gabriel Franklin                       MRN: [id number] Adm. Date:  [id number] Attending:  Mervin Hack CC:         Quita Skye. Artis Flock, M.D.   Procedure Report  PROCEDURE:  Upper endoscopy.  INDICATIONS:  This 50 year old gentleman has complained of intermittent solid food dysphagia that started about two years ago.  He denies any heartburn or symptoms of reflux.  He denies any trauma to his esophagus in the past.  His weight has been stable.  He does not drink any alcohol and does not smoke, but at times has taken aspirin on a regular basis.  On barium swallow, he had a 2 mm lesion in the mid esophagus, which suggested a small polyp.  There was no definite stricture.  He is undergoing upper endoscopy to further evaluate his dysphagia.  ENDOSCOPE:  Olympus single-channel video endoscope.  SEDATION:  Versed 10 mg IV and Demerol 50 mg IV.  FINDINGS:  The Olympus single-channel video endoscope was passed under direct vision through the posterior pharynx into the esophagus.  The patient was monitored by pulse oximeter.  Oxygen saturations were normal.  He was very cooperative.  Gag reflex was normal.  The proximal esophagus was normal. Starting at 30 cm from the incisors, the esophagus was rather narrow with irregular mucosa with some scar tissue, indentations, and fibrotic nodules suggesting old healed up esophagitis.  The nodule that was seen on barium swallow appeared fibrotic.  Biopsies were taken of it and sent to pathology. The endoscope barely passed through the fusiform stricture from 30-35 cm from the incisors.  Another stricture was located between 35-40 cm from the incisors.  It measured at maximum diameter about 11 mm.  The endoscope could hardly traverse through it.  It seemed that the peristaltic ______ in the esophagus were decreased and I had to push the endoscope against some resistance in order to pass it  through a concentrically narrow esophagus. There was a small reducible hiatal hernia which was not clinically significant.  After passage of the endoscope, there were visible three abrasions at the site of the strictures with superficial tears from dilatation.  Stomach:  The stomach was insufflated with air and showed normal-appearing gastric mucosa, antrum, and pylorus.  Retroflexion of the endoscope revealed normal fundus and cardia.  Duodenum:  The duodenal bulb and descending duodenum were normal.  The endoscope scope was then brought back into the esophagus and the area of the sutures was biopsied and sent to pathology in separate bottles.  The patient tolerated the procedure well.  IMPRESSION: 1. Two fusiform esophageal strictures of moderate severity, status post    dilatation with endoscope. 2. Chronic esophagitis with acute esophagitis due to scope dilatation.  PLAN: 1. Prilosec 200 mg a day. 2. No aspirin. 3. Repeat esophageal dilation with dilator under fluoroscopy in four weeks. DD:  03/22/00 TD:  03/22/00 Job: [id number]

## 2010-11-20 NOTE — Procedures (Signed)
[facility]  Patient:    Gabriel Franklin, Gabriel Franklin                       MRN: [id number] Adm. Date:  [id number] Attending:  Mervin Hack                           Procedure Report  PROCEDURE:  Upper endoscopy with esophageal dilation.  INDICATIONS:  This 50 year old gentleman has a history of concentric smooth esophageal stricture located in the proximal as well as distal esophagus. The etiology of this was not clear.  He was dilated two months ago and was scheduled for repeat dilatation approximately four weeks ago, but was unable to keep his appointment.  He is now rescheduled for esophageal dilatation. The initial stricture was rather tight and was not dilated other than the passage of the scope.  He has had some improvement in his dysphagia.  He has been on Prilosec 20 mg q.d., which seemed to be helping to relieve the symptoms of reflux.  ENDOSCOPE:  Olympus single channel videoscope.  SEDATION:  Versed 10 IV, Demerol 100 mg IV.  FINDINGS:  The Olympus single channel videoscope was passed under direct vision through the posterior pharynx into the esophagus.  The patient was monitored by pulse oximeter.  His oxygen saturations were 100%.  He was cooperative, although he required a large amount of sedation.  The vallecula and piriform sinuses were normal.  Starting in the proximal esophagus at around 20 cm, there was smooth narrowing of the esophagus.  It initially was not noted to be significant until the endoscope started to pass through it, where the resistance was increased.  The diameter of the proximal stricture was about 12 mm, which was the diameter of the scope.  As the endoscope traversed through the proximal and mid esophagus, ______ were left on the mucosa of the esophagus, indicating some dilatation of the esophagus with the scope.  Distally, at about 40 cm from the incisors, was another stricture, which at this time had some bleeding  after the endoscope traversed through it.  there was no significant hiatal hernia.  There was also some irregularity of the mucosa in the mid esophagus with some pseudodiverticula formation and fibrous tissue, indicating healed esophagitis.  STOMACH:  The stomach was insufflated with air and showed normal gastric mucosa, pyloric outlet and duodenum.  Under fluoroscopic guidance, a guidewire was placed into the stomach.  The endoscope was retracted and Savary dilators passed over the guidewire using 12, 12.8 and finally 14 mm dilators, which passed with some resistance over the guidewire, with some blood seen on the dilator.  At this point, dilatation was terminated.  IMPRESSION:  Concentric diffuse esophageal narrowing status post Savary dilation to #42 [place].  PLAN: 1. Continue Prilosec 20 mg q.d. 2. Clear liquids for the next two hours, then a soft diet. 3. Depending on the results of the dilatation, the effects of it and any    residual dysphagia, the patient may need to repeat dilatation in four    weeks.  If his dysphagia entirely resolves, I would not attempt another    dilatation until he becomes symptomatic.  The etiology of his stricture is    not clear.  I believe it was either a lye ingestion, but he does not have    any recall of it as a child, or as a result of severe reflux  disease, which    is affecting motility and causing further scarring and dysmotility.  The    goal of the treatment at this time would be to dilate his esophagus enough    to prevent him from solid food dysphagia. DD:  05/11/00 TD:  05/11/00 Job: [id number]

## 2011-09-06 ENCOUNTER — Encounter: Payer: Self-pay | Admitting: Internal Medicine

## 2013-03-22 ENCOUNTER — Other Ambulatory Visit: Payer: Self-pay | Admitting: Dermatology

## 2014-08-05 ENCOUNTER — Encounter: Payer: Self-pay | Admitting: Internal Medicine

## 2014-09-17 ENCOUNTER — Encounter: Payer: Self-pay | Admitting: Internal Medicine

## 2015-03-27 ENCOUNTER — Encounter: Payer: Self-pay | Admitting: Internal Medicine

## 2015-10-16 ENCOUNTER — Encounter: Payer: Self-pay | Admitting: Internal Medicine

## 2016-04-05 ENCOUNTER — Encounter: Payer: Self-pay | Admitting: Gastroenterology

## 2016-05-21 ENCOUNTER — Ambulatory Visit (AMBULATORY_SURGERY_CENTER): Payer: Self-pay | Admitting: *Deleted

## 2016-05-21 VITALS — Ht 72.0 in | Wt 205.0 lb

## 2016-05-21 DIAGNOSIS — Z800 Family history of malignant neoplasm of digestive organs: Secondary | ICD-10-CM

## 2016-05-21 MED ORDER — NA SULFATE-K SULFATE-MG SULF 17.5-3.13-1.6 GM/177ML PO SOLN: 1.0000 | Freq: Once | ORAL | 0 refills | Status: AC

## 2016-05-21 NOTE — Progress Notes (Signed)
No egg or soy allergy known to patient  No issues with past sedation with any surgeries  or procedures, no intubation problems  No diet pills per patient No home 02 use per patient  No blood thinners per patient  Pt denies issues with constipation  No A fib or A flutter   emmi declined'   

## 2016-06-04 ENCOUNTER — Encounter: Payer: Self-pay | Admitting: Gastroenterology

## 2018-01-17 ENCOUNTER — Other Ambulatory Visit: Payer: Self-pay | Admitting: Family Medicine

## 2018-01-17 DIAGNOSIS — M25511 Pain in right shoulder: Secondary | ICD-10-CM

## 2018-01-21 ENCOUNTER — Ambulatory Visit (HOSPITAL_BASED_OUTPATIENT_CLINIC_OR_DEPARTMENT_OTHER)
Admission: RE | Admit: 2018-01-21 | Discharge: 2018-01-21 | Disposition: A | Discharge status: Home / Self Care | Payer: BLUE CROSS/BLUE SHIELD | Source: Ambulatory Visit | Attending: Family Medicine | Admitting: Family Medicine

## 2018-01-21 DIAGNOSIS — M7551 Bursitis of right shoulder: Secondary | ICD-10-CM | POA: Insufficient documentation

## 2018-01-21 DIAGNOSIS — M25511 Pain in right shoulder: Secondary | ICD-10-CM | POA: Insufficient documentation

## 2020-02-22 IMAGING — MR MR SHOULDER*R* W/O CM
5 series · 40 of 40 positions shown · non-contrast
Comparison: None

CLINICAL DATA: Right shoulder pain for 1 month.  No known injury.

EXAM:
MRI OF THE RIGHT SHOULDER WITHOUT CONTRAST
TECHNIQUE: Multiplanar, multisequence MR imaging of the shoulder was performed.
No intravenous contrast was administered.

[Series 3: PD fat-sat · axial · 4.0mm · 0.55mm/px · z∈[-47,+59]mm · 8 of 20 slices shown (1 of 2)]
[im 1/20]
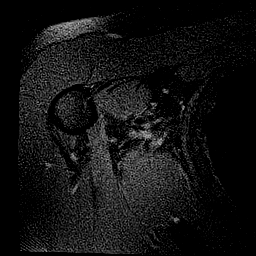
[im 3/20]
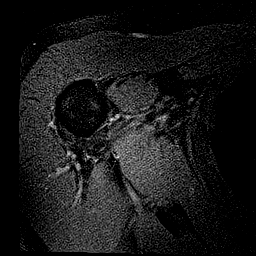
[im 6/20]
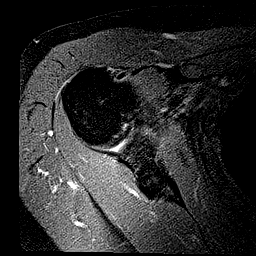
[im 9/20]
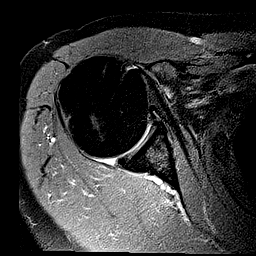
[im 11/20]
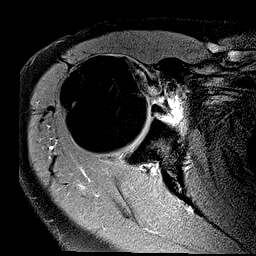
[im 14/20]
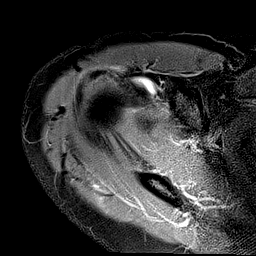
[im 17/20]
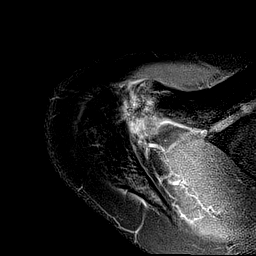
[im 20/20]
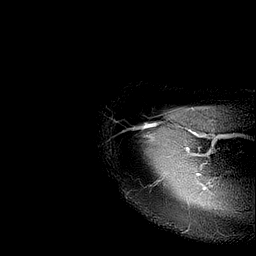

[Series 4: T2 fat-sat · oblique · 4.0mm · 0.55mm/px · 8 of 22 slices shown (1 of 2)]
[im 1/22]
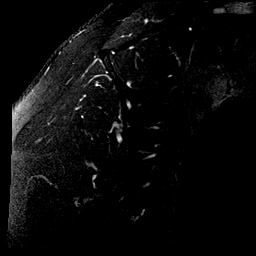
[im 4/22]
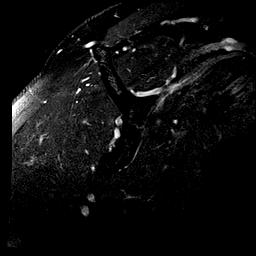
[im 7/22]
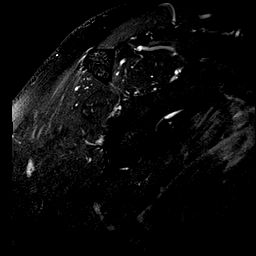
[im 10/22]
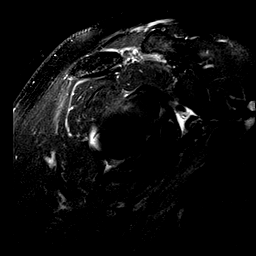
[im 13/22]
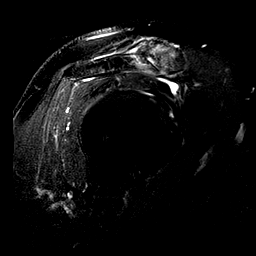
[im 16/22]
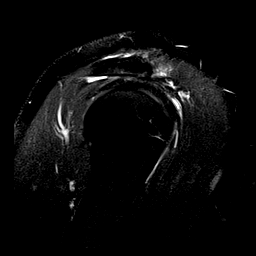
[im 19/22]
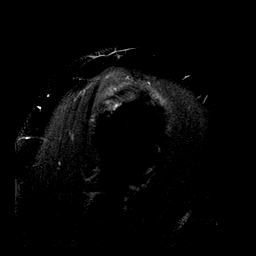
[im 22/22]
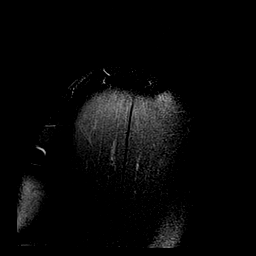

[Series 5: T2 fat-sat · sagittal · 4.0mm · 0.55mm/px · 8 of 22 slices shown (2 of 2)]
[im 1/22]
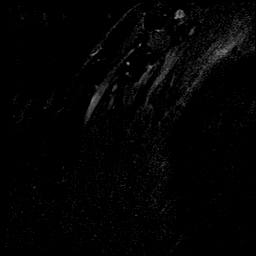
[im 4/22]
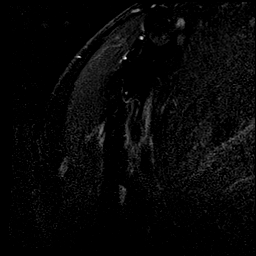
[im 7/22]
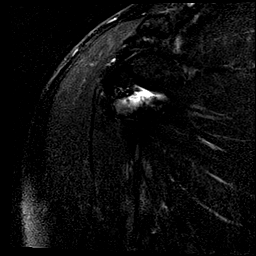
[im 10/22]
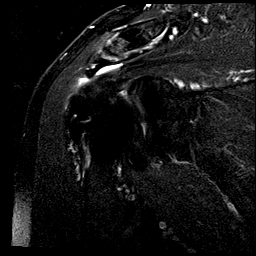
[im 13/22]
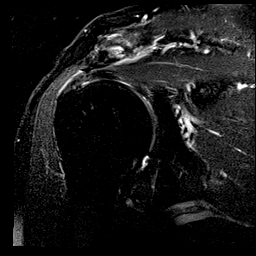
[im 16/22]
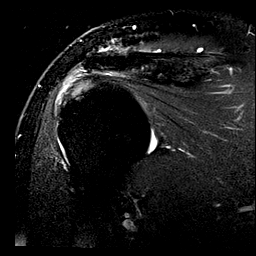
[im 19/22]
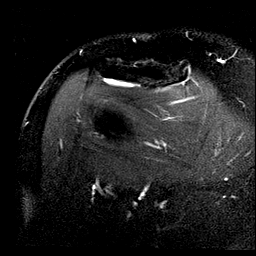
[im 22/22]
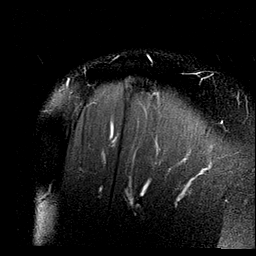

[Series 6: PD fat-sat · sagittal · 4.0mm · 0.55mm/px · 8 of 22 slices shown (2 of 2)]
[im 1/22]
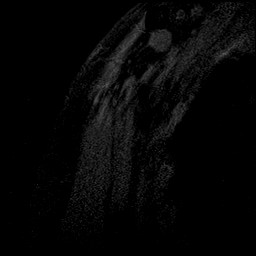
[im 4/22]
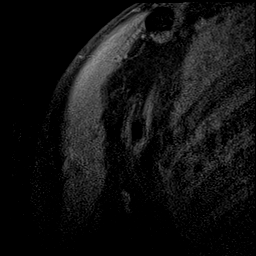
[im 7/22]
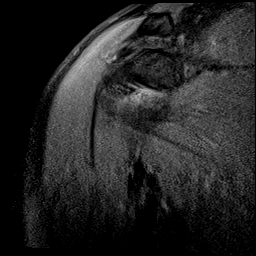
[im 10/22]
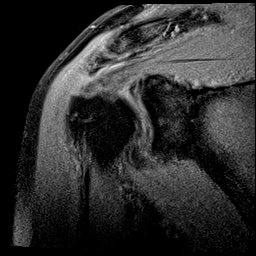
[im 13/22]
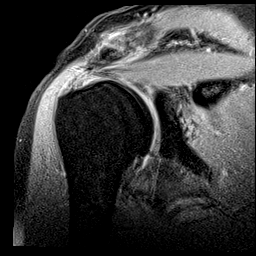
[im 16/22]
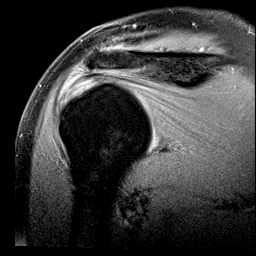
[im 19/22]
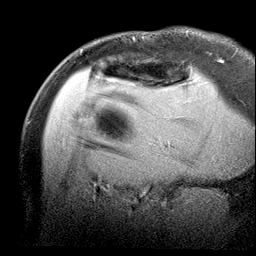
[im 22/22]
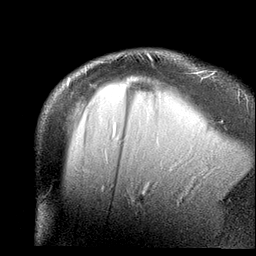

[Series 7: T1 · oblique · 4.0mm · 0.55mm/px · 8 of 22 slices shown]
[im 1/22]
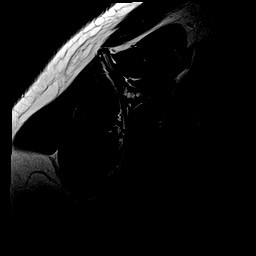
[im 4/22]
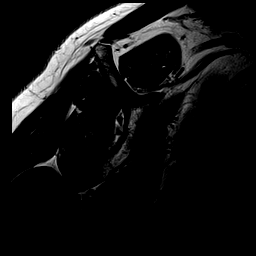
[im 7/22]
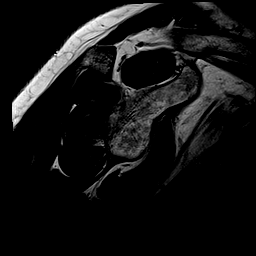
[im 10/22]
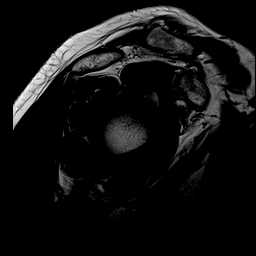
[im 13/22]
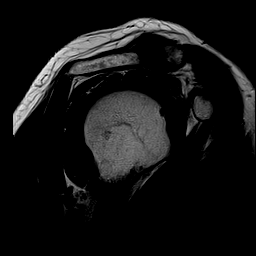
[im 16/22]
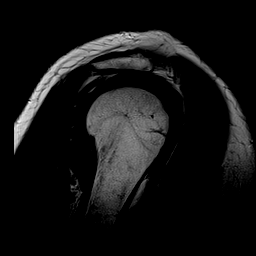
[im 19/22]
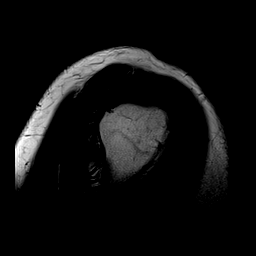
[im 22/22]
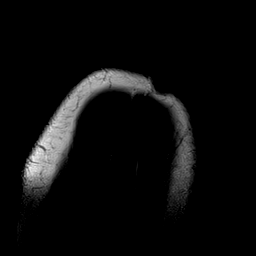

[40 of 40 positions shown; findings below may reference images not displayed]

FINDINGS: Rotator cuff: Moderate rotator cuff tendinopathy/tendinosis with
areas of interstitial tears. Shallow articular surface tearing along
the supraspinatus infraspinatus junction region. Maximum laminar
retraction of the articular fibers is 9 mm. No full-thickness
retracted tear.

Muscles:  Normal

Biceps long head:  Intact

Acromioclavicular Joint: Mild to moderate degenerative changes type
1 acromion. No lateral downsloping or undersurface spurring.

Glenohumeral Joint: Mild degenerative changes.  No joint effusion.

Labrum:  No definite labral tears.

Bones:  No acute bony findings.

Other: Mild to moderate subacromial/subdeltoid bursitis.
IMPRESSION: 1. Moderate rotator cuff tendinopathy/tendinosis with articular
surface tearing along the supraspinatus infraspinatus junction
region. No full-thickness retracted tear.
2. Intact long head biceps tendon and glenoid labrum.
3. No significant findings for bony impingement.
4. Mild to moderate subacromial/subdeltoid bursitis.

## 2020-11-10 ENCOUNTER — Encounter: Payer: Self-pay | Admitting: Gastroenterology

## 2020-12-12 ENCOUNTER — Encounter: Payer: Self-pay | Admitting: Gastroenterology

## 2023-01-13 ENCOUNTER — Other Ambulatory Visit: Payer: Self-pay | Admitting: Internal Medicine

## 2023-01-13 DIAGNOSIS — E785 Hyperlipidemia, unspecified: Secondary | ICD-10-CM

## 2023-01-25 ENCOUNTER — Ambulatory Visit (HOSPITAL_BASED_OUTPATIENT_CLINIC_OR_DEPARTMENT_OTHER)
Admission: RE | Admit: 2023-01-25 | Discharge: 2023-01-25 | Disposition: A | Discharge status: Home / Self Care | Payer: BLUE CROSS/BLUE SHIELD | Source: Ambulatory Visit | Attending: Internal Medicine | Admitting: Internal Medicine

## 2023-01-25 DIAGNOSIS — E785 Hyperlipidemia, unspecified: Secondary | ICD-10-CM | POA: Insufficient documentation

## 2023-03-22 NOTE — Progress Notes (Addendum)
Gabriel Burn, NP Reason for referral-hyperlipidemia and coronary calcification  HPI: 62 year old male for evaluation of hyperlipidemia and coronary calcification at request of Blondell Reveal, NP.  Calcium score July 2024 301 which was 84th percentile.  Patient denies dyspnea, chest pain, palpitations or syncope.  Current Outpatient Medications  Medication Sig Dispense Refill   lansoprazole (PREVACID) 30 MG capsule Take 15 mg by mouth daily.      Pediatric Multivit-Minerals-C (CHEWABLES MULTIVITAMIN PO) Take 1 tablet by mouth daily.     pramoxine (PROCTOFOAM) 1 % foam Place rectally as directed.       rosuvastatin (CRESTOR) 10 MG tablet Take 10 mg by mouth daily.       telmisartan-hydrochlorothiazide (MICARDIS HCT) 40-12.5 MG per tablet Take 1 tablet by mouth daily.       No current facility-administered medications for this visit.    Allergies  Allergen Reactions   Penicillins     REACTION: rash   Propoxyphene Hcl     REACTION: rash-- Darvon      Past Medical History:  Diagnosis Date   Allergy    Barrett's esophagus    Coronary artery calcification    Diverticulosis    Esophageal stricture    GERD (gastroesophageal reflux disease)    Hiatal hernia    Hyperlipidemia    Hypertension     Past Surgical History:  Procedure Laterality Date   COLONOSCOPY     INGUINAL HERNIA REPAIR     bilateral   osteomylitis in ankle     age 52 with surgery   UPPER GASTROINTESTINAL ENDOSCOPY      Social History   Socioeconomic History   Marital status: Married    Spouse name: Not on file   Number of children: 2   Years of education: Not on file   Highest education level: Not on file  Occupational History   Occupation: Charity fundraiser  Tobacco Use   Smoking status: Never   Smokeless tobacco: Never  Substance and Sexual Activity   Alcohol use: Yes    Comment: one daily   Drug use: No   Sexual activity: Not on file  Other Topics Concern   Not on file  Social History  Narrative   Not on file   Social Determinants of Health   Financial Resource Strain: Not on file  Food Insecurity: Not on file  Transportation Needs: Not on file  Physical Activity: Not on file  Stress: Not on file  Social Connections: Unknown (11/17/2021)   Received from [facility]   Social Network    Social Network: Not on file  Intimate Partner Violence: Unknown (10/09/2021)   Received from [facility]   HITS    Physically Hurt: Not on file    Insult or Talk Down To: Not on file    Threaten Physical Harm: Not on file    Scream or Curse: Not on file    Family History  Problem Relation Age of Onset   Colon cancer Mother        questionable   Alzheimer's disease Mother    Prostate cancer Father    Heart attack Brother    Colon polyps Neg Hx    Rectal cancer Neg Hx    Stomach cancer Neg Hx     ROS: no fevers or chills, productive cough, hemoptysis, dysphasia, odynophagia, melena, hematochezia, dysuria, hematuria, rash, seizure activity, orthopnea, PND, pedal edema, claudication. Remaining systems are negative.  Physical Exam:   Blood pressure (!) 142/82, pulse Marland Kitchen)  56, height 6' (1.829 m), weight 213 lb 6.4 oz (96.8 kg), SpO2 99%.  General:  Well developed/well nourished in NAD Skin warm/dry Patient not depressed No peripheral clubbing Back-normal HEENT-normal/normal eyelids Neck supple/normal carotid upstroke bilaterally; no bruits; no JVD; no thyromegaly chest - CTA/ normal expansion CV - RRR/normal S1 and S2; no rubs or gallops;  PMI nondisplaced; 1/6 systolic murmur apex Abdomen -NT/ND, no HSM, no mass, + bowel sounds, no bruit 2+ femoral pulses, no bruits Ext-no edema, chords, 2+ DP Neuro-grossly nonfocal  EKG Interpretation Date/Time:  Monday April 04 2023 09:41:41 EDT Ventricular Rate:  56 PR Interval:  152 QRS Duration:  106 QT Interval:  446 QTC Calculation: 430 R Axis:   92  Text Interpretation: Sinus bradycardia Rightward axis Incomplete  right bundle branch block No previous ECGs available Confirmed by Olga Millers ([id number]) on 04/04/2023 9:52:44 AM    A/P  1 coronary calcification-patient with elevated calcium score.  However he exercises routinely biking and walking with no chest pain.  Plan medical therapy.  Add aspirin 81 mg daily.  Continue statin.  Will arrange echocardiogram to assess LV function.  Has a soft systolic murmur at the apex as well.  2 hyperlipidemia-will increase Crestor to 40 mg daily.  Note he had myalgias with Lipitor.  If he does not tolerate we will consider addition of Zetia or PCSK9 inhibitor.  Will check lipids, liver and LP(a) in 8 weeks.  3 hypertension-blood pressure is borderline.  He will follow this with goal systolic blood pressure 130.  If it remains elevated we will add additional medications.  We also discussed lifestyle modification.  Olga Millers, MD

## 2023-04-04 ENCOUNTER — Encounter: Payer: Self-pay | Admitting: Cardiology

## 2023-04-04 ENCOUNTER — Ambulatory Visit: Payer: 59 | Admitting: Cardiology

## 2023-04-04 VITALS — BP 142/82 | HR 56 | Ht 72.0 in | Wt 213.4 lb

## 2023-04-04 DIAGNOSIS — R9431 Abnormal electrocardiogram [ECG] [EKG]: Secondary | ICD-10-CM

## 2023-04-04 DIAGNOSIS — I2510 Atherosclerotic heart disease of native coronary artery without angina pectoris: Secondary | ICD-10-CM

## 2023-04-04 DIAGNOSIS — I10 Essential (primary) hypertension: Secondary | ICD-10-CM

## 2023-04-04 DIAGNOSIS — I2584 Coronary atherosclerosis due to calcified coronary lesion: Secondary | ICD-10-CM | POA: Diagnosis not present

## 2023-04-04 MED ORDER — ASPIRIN 81 MG PO TBEC: 81.0000 mg | DELAYED_RELEASE_TABLET | Freq: Every day | ORAL | Status: AC

## 2023-04-04 MED ORDER — ROSUVASTATIN CALCIUM 40 MG PO TABS
40.0000 mg | ORAL_TABLET | Freq: Every day | ORAL | 3 refills | Status: DC
Start: 2023-04-04 — End: 2024-05-08

## 2023-04-04 NOTE — Patient Instructions (Signed)
Medication Instructions:   INCREASE ROSUVASTATIN TO 40 MG ONCE DAILY= 4 OF THE 10 MG TABLETS ONCE DAILY  START ASPIRIN 81 MG ONCE DAILY  *If you need a refill on your cardiac medications before your next appointment, please call your pharmacy*   Lab Work:  Your physician recommends that you return for lab work in: 8 WEEKS=FASTING  If you have labs (blood work) drawn today and your tests are completely normal, you will receive your results only by: MyChart Message (if you have MyChart) OR A paper copy in the mail If you have any lab test that is abnormal or we need to change your treatment, we will call you to review the results.   Follow-Up: At [facility], you and your health needs are our priority.  As part of our continuing mission to provide you with exceptional heart care, we have created designated Provider Care Teams.  These Care Teams include your primary Cardiologist (physician) and Advanced Practice Providers (APPs -  Physician Assistants and Nurse Practitioners) who all work together to provide you with the care you need, when you need it.  We recommend signing up for the patient portal called "MyChart".  Sign up information is provided on this After Visit Summary.  MyChart is used to connect with patients for Virtual Visits (Telemedicine).  Patients are able to view lab/test results, encounter notes, upcoming appointments, etc.  Non-urgent messages can be sent to your provider as well.   To learn more about what you can do with MyChart, go to [email or web address].    Your next appointment:   12 month(s)  Provider:   Olga Millers, MD

## 2023-04-05 ENCOUNTER — Telehealth: Payer: Self-pay | Admitting: *Deleted

## 2023-04-05 DIAGNOSIS — R011 Cardiac murmur, unspecified: Secondary | ICD-10-CM

## 2023-04-05 NOTE — Telephone Encounter (Signed)
Left message for pt to call, yesterday after he left dr Jens Som wants to do an echo because of a cardiac murmur. Order placed for echo to be done at the [facility].

## 2023-04-05 NOTE — Telephone Encounter (Signed)
Patient returned RN Debra's call regarding results.

## 2023-04-05 NOTE — Telephone Encounter (Signed)
Called pt to relay message. All questions asked and answered. No further questions at this time. Pt will wait for call to schedule echo.

## 2023-06-10 ENCOUNTER — Encounter: Payer: Self-pay | Admitting: *Deleted

## 2023-10-08 ENCOUNTER — Emergency Department (HOSPITAL_BASED_OUTPATIENT_CLINIC_OR_DEPARTMENT_OTHER)
Admission: EM | Admit: 2023-10-08 | Discharge: 2023-10-08 | Disposition: A | Discharge status: Home / Self Care | Attending: Emergency Medicine | Admitting: Emergency Medicine

## 2023-10-08 ENCOUNTER — Encounter (HOSPITAL_BASED_OUTPATIENT_CLINIC_OR_DEPARTMENT_OTHER): Payer: Self-pay | Admitting: Emergency Medicine

## 2023-10-08 ENCOUNTER — Other Ambulatory Visit: Payer: Self-pay

## 2023-10-08 ENCOUNTER — Emergency Department (HOSPITAL_BASED_OUTPATIENT_CLINIC_OR_DEPARTMENT_OTHER)

## 2023-10-08 DIAGNOSIS — Z7982 Long term (current) use of aspirin: Secondary | ICD-10-CM | POA: Insufficient documentation

## 2023-10-08 DIAGNOSIS — R079 Chest pain, unspecified: Secondary | ICD-10-CM

## 2023-10-08 DIAGNOSIS — R0602 Shortness of breath: Secondary | ICD-10-CM | POA: Insufficient documentation

## 2023-10-08 DIAGNOSIS — Z5329 Procedure and treatment not carried out because of patient's decision for other reasons: Secondary | ICD-10-CM | POA: Insufficient documentation

## 2023-10-08 DIAGNOSIS — R7989 Other specified abnormal findings of blood chemistry: Secondary | ICD-10-CM | POA: Diagnosis present

## 2023-10-08 DIAGNOSIS — Z79899 Other long term (current) drug therapy: Secondary | ICD-10-CM | POA: Diagnosis not present

## 2023-10-08 DIAGNOSIS — I10 Essential (primary) hypertension: Secondary | ICD-10-CM | POA: Insufficient documentation

## 2023-10-08 DIAGNOSIS — R0789 Other chest pain: Secondary | ICD-10-CM | POA: Insufficient documentation

## 2023-10-08 LAB — TROPONIN I (HIGH SENSITIVITY)
Troponin I (High Sensitivity): 38 ng/L — ABNORMAL HIGH (ref <18)
Troponin I (High Sensitivity): 52 ng/L — ABNORMAL HIGH (ref <18)

## 2023-10-08 LAB — CBC
HCT: 38.5 % — ABNORMAL LOW (ref 39.0–52.0)
Hemoglobin: 13.2 g/dL (ref 13.0–17.0)
MCH: 31.2 pg (ref 26.0–34.0)
MCHC: 34.3 g/dL (ref 30.0–36.0)
MCV: 91 fL (ref 80.0–100.0)
Platelets: 253 10*3/uL (ref 150–400)
RBC: 4.23 MIL/uL (ref 4.22–5.81)
RDW: 13.2 % (ref 11.5–15.5)
WBC: 9.1 10*3/uL (ref 4.0–10.5)
nRBC: 0 % (ref 0.0–0.2)

## 2023-10-08 LAB — BASIC METABOLIC PANEL WITH GFR
Anion gap: 10 (ref 5–15)
BUN: 26 mg/dL — ABNORMAL HIGH (ref 6–20)
CO2: 23 mmol/L (ref 22–32)
Calcium: 9.2 mg/dL (ref 8.9–10.3)
Chloride: 102 mmol/L (ref 98–111)
Creatinine, Ser: 1.57 mg/dL — ABNORMAL HIGH (ref 0.61–1.24)
GFR, Estimated: 50 mL/min — ABNORMAL LOW (ref >60)
Glucose, Bld: 178 mg/dL — ABNORMAL HIGH (ref 70–99)
Potassium: 3.6 mmol/L (ref 3.5–5.1)
Sodium: 135 mmol/L (ref 135–145)

## 2023-10-08 LAB — CK: Total CK: 150 U/L (ref 49–397)

## 2023-10-08 LAB — D-DIMER, QUANTITATIVE: D-Dimer, Quant: <0.27 ug{FEU}/mL (ref 0.00–0.50)

## 2023-10-08 MED ORDER — ASPIRIN 81 MG PO CHEW
324.0000 mg | CHEWABLE_TABLET | Freq: Once | ORAL | Status: AC
  Administered 2023-10-08: 324 mg via ORAL
  Filled 2023-10-08: qty 4

## 2023-10-08 MED ORDER — SODIUM CHLORIDE 0.9 % IV BOLUS
1000.0000 mL | Freq: Once | INTRAVENOUS | Status: AC
  Administered 2023-10-08: 1000 mL via INTRAVENOUS

## 2023-10-08 NOTE — ED Notes (Signed)
 Patient leaving AMA at this time. Dr. Rubin Payor reviewed risks of leaving and benefits of staying in the hospital. Patient verbally acknowledged understanding. Patient is AAOX4. Electronic AMA form signed on computer.

## 2023-10-08 NOTE — Discharge Instructions (Addendum)
 You are having chest pain and her troponin is elevated.  Your pain could be coming from your heart.  You are leaving against our advice which increases your risk of death.  Feel free to return at any time and follow-up with soon as possible.

## 2023-10-08 NOTE — ED Triage Notes (Signed)
 Pt reports elevated HR today and noted BP was lower than normal; denies pain

## 2023-10-08 NOTE — ED Provider Notes (Signed)
 [place] [facility] Provider Note   CSN: [id number] Arrival date & time: 10/08/23  1851     History  Chief Complaint  Patient presents with   Palpitations    Gabriel Franklin is a 63 y.o. male.   Palpitations Patient presents with fast heart rate.  Some mild shortness of breath.  States he did his regular bike ride yesterday and was maybe a little more fatigued than normal.  States today he mowed the lawn.  States it is walking more and he has about half an acre.  States he was more short of breath than he normally does.  Potentially had some mild chest tightness.  States that he was resting after his heart rate was still going fast.  States his heart still feels a little quicker than normal.  Had cardiac calcium score done around 9 months ago and was found to have 84th percentile.    Past Medical History:  Diagnosis Date   Allergy    Barrett's esophagus    Coronary artery calcification    Diverticulosis    Esophageal stricture    GERD (gastroesophageal reflux disease)    Hiatal hernia    Hyperlipidemia    Hypertension     Home Medications Prior to Admission medications   Medication Sig Start Date End Date Taking? Authorizing Provider  aspirin EC 81 MG tablet Take 1 tablet (81 mg total) by mouth daily. Swallow whole. 04/04/23   Lewayne Bunting, MD  lansoprazole (PREVACID) 30 MG capsule Take 15 mg by mouth daily.     [provider]  Pediatric Multivit-Minerals-C (CHEWABLES MULTIVITAMIN PO) Take 1 tablet by mouth daily.    [provider]  pramoxine (PROCTOFOAM) 1 % foam Place rectally as directed.      [provider]  rosuvastatin (CRESTOR) 40 MG tablet Take 1 tablet (40 mg total) by mouth daily. 04/04/23   Lewayne Bunting, MD  telmisartan-hydrochlorothiazide (MICARDIS HCT) 40-12.5 MG per tablet Take 1 tablet by mouth daily.      [provider]      Allergies    Penicillins and Propoxyphene hcl     Review of Systems   Review of Systems  Cardiovascular:  Positive for palpitations.    Physical Exam Updated Vital Signs BP (!) 153/96   Pulse 81   Temp (!) 97.3 F (36.3 C)   Resp (!) 26   Ht 6' (1.829 m)   Wt 97.1 kg   SpO2 98%   BMI 29.02 kg/m  Physical Exam Vitals and nursing note reviewed.  Pulmonary:     Breath sounds: No wheezing.  Abdominal:     Tenderness: There is no abdominal tenderness.  Musculoskeletal:        General: No tenderness.  Skin:    Capillary Refill: Capillary refill takes less than 2 seconds.  Neurological:     Mental Status: He is alert and oriented to person, place, and time.     ED Results / Procedures / Treatments   Labs (all labs ordered are listed, but only abnormal results are displayed) Labs Reviewed  BASIC METABOLIC PANEL WITH GFR - Abnormal; Notable for the following components:      Result Value   Glucose, Bld 178 (*)    BUN 26 (*)    Creatinine, Ser 1.57 (*)    GFR, Estimated 50 (*)    All other components within normal limits  CBC - Abnormal; Notable for the following components:  HCT 38.5 (*)    All other components within normal limits  TROPONIN I (HIGH SENSITIVITY) - Abnormal; Notable for the following components:   Troponin I (High Sensitivity) 38 (*)    All other components within normal limits  TROPONIN I (HIGH SENSITIVITY) - Abnormal; Notable for the following components:   Troponin I (High Sensitivity) 52 (*)    All other components within normal limits  D-DIMER, QUANTITATIVE  CK    EKG EKG Interpretation Date/Time:  Saturday October 08 2023 18:58:40 EDT Ventricular Rate:  103 PR Interval:  158 QRS Duration:  87 QT Interval:  345 QTC Calculation: 452 R Axis:   -89  Text Interpretation: Sinus tachycardia Consider left atrial enlargement Left anterior fascicular block RSR' in V1 or V2, right VCD or RVH rate incresaed since last tracing Confirmed by Benjiman Core [phone]) on 10/08/2023 7:14:06  PM  Radiology DG Chest 2 View Result Date: 10/08/2023 CLINICAL DATA:  Chest pain. EXAM: CHEST - 2 VIEW COMPARISON:  None Available. FINDINGS: No focal consolidation, pleural effusion, pneumothorax. Left hilar calcified granuloma. Small probable calcified granuloma in the left lower lobe. The cardiac silhouette is within limits. No acute osseous pathology. IMPRESSION: No active cardiopulmonary disease. Electronically Signed   By: Elgie Collard M.D.   On: 10/08/2023 19:49    Procedures Procedures    Medications Ordered in ED Medications  sodium chloride 0.9 % bolus 1,000 mL (0 mLs Intravenous Stopped 10/08/23 2116)  aspirin chewable tablet 324 mg (324 mg Oral Given 10/08/23 2028)    ED Course/ Medical Decision Making/ A&P                                 Medical Decision Making Amount and/or Complexity of Data Reviewed Labs: ordered. Radiology: ordered.  Risk OTC drugs. Decision regarding hospitalization.   Patient shortness of breath.  Tachycardia.  Some chest tightness with exertion today.  Does have known elevated calcium score.  Differential diagnosis long but includes cause such as anemia, coronary artery disease/ACS.  No recent travel.  No swelling of his legs.  Creatinine mildly elevated.  Unsure of baseline but potentially could be due to his recent work outside.  Heart rate is come down.  However troponin mildly elevated at 38.  Discussed with Dr. Regino Schultze from cardiology.  No heparin at this time but will admit for further monitoring.  Admit to internal medicine. Discussed with Dr. Antionette Char from hospitalist.  CRITICAL CARE Performed by: Benjiman Core Total critical care time: 30 minutes Critical care time was exclusive of separately billable procedures and treating other patients. Critical care was necessary to treat or prevent imminent or life-threatening deterioration. Critical care was time spent personally by me on the following activities: development of treatment plan  with patient and/or surrogate as well as nursing, discussions with consultants, evaluation of patient's response to treatment, examination of patient, obtaining history from patient or surrogate, ordering and performing treatments and interventions, ordering and review of laboratory studies, ordering and review of radiographic studies, pulse oximetry and re-evaluation of patient's condition.  Patient's repeat troponin has increased but not by a large amount.  Patient not willing to stay for admission however.  States he knows the risk.  Notes there is chance of death.  States he will follow-up as an outpatient.  Cardiology consult given for outpatient.  Discharge AMA.  Discussed with patient and his wife.  Final Clinical Impression(s) / ED Diagnoses Final diagnoses:  Elevated troponin  Chest pain, unspecified type    Rx / DC Orders ED Discharge Orders          Ordered    Ambulatory referral to Cardiology       Comments: If you have not heard from the Cardiology office within the next 72 hours please call [phone].   10/08/23 2215              Benjiman Core, MD 10/08/23 2240

## 2023-10-08 NOTE — Progress Notes (Signed)
 Plan of Care Note for accepted transfer   Patient: Gabriel Franklin MRN: [id number]   DOA: 10/08/2023  Facility requesting transfer: [facility]   Requesting Provider: Dr. Rubin Payor   Reason for transfer: Elevated troponin   Facility course: 63 yr old man with HTN, HLD, hiatal hernia, and coronary artery calcifications who presents with increased fatigue, DOE, and vague malaise.   He is typically very active physically but became more dyspneic and fatigued than usual with a bike ride yesterday and then developed fatigue, DOE, and malaise with chest tightness while mowing a lawn today. He noted his resting HR to be higher than usual and his BP to be lower than usual.   In the ED, EKG reveals sinus tachycardia with rate 103, there are no acute findings on CXR, D-dimer is <0.27, and initial troponin is 38.   ED physician discussed the case with the cardiology fellow and administered 324 mg ASA and 1 liter NS. Plan is to observe on telemetry, repeat troponin, but not treat as ACS unless second troponin significantly increased.   Plan of care: The patient is accepted for admission to Telemetry unit, at [facility].   Author: Briscoe Deutscher, MD 10/08/2023  Check [email or web address] for on-call coverage.  Nursing staff, Please call [facility] Admits & Consults System-Wide number on Amion as soon as patient's arrival, so appropriate admitting provider can evaluate the pt.

## 2023-10-09 ENCOUNTER — Encounter: Payer: Self-pay | Admitting: Physician Assistant

## 2023-10-09 NOTE — Progress Notes (Signed)
 Received sign out from overnight covering cardiologist that patient was pending transfer from [facility]. Patient appears to have left AMA. Message sent to schedulers via staff msg to call patient to arrange post-ER f/u.

## 2023-10-11 ENCOUNTER — Ambulatory Visit (HOSPITAL_COMMUNITY): Attending: Cardiology

## 2023-10-11 DIAGNOSIS — R011 Cardiac murmur, unspecified: Secondary | ICD-10-CM | POA: Diagnosis present

## 2023-10-11 LAB — ECHOCARDIOGRAM COMPLETE
Area-P 1/2: 3.65 cm2
S' Lateral: 2.7 cm

## 2024-01-03 NOTE — Progress Notes (Signed)
 Franki River, MD Reason for referral-elevated troponin  HPI: 63 year old male for evaluation of elevated troponin at request of Rankin River, MD.  Patient seen previously in September 2024 for for coronary calcification.  Calcium  score July 2024 301 which was 84th percentile.  Patient seen in the emergency room October 08, 2023 with complaints of fast heart rate, increased dyspnea with activities.  Troponin I 52 and 38.  D-dimer less than 0.27.  Creatinine 1.57, hemoglobin 13.2.  Echocardiogram April 2025 showed normal LV function.  Admission was recommended but patient declined.  Cardiology now asked to evaluate.  On the day he was seen in the emergency room the patient states he was mowing his yard and became fatigued.  Later that afternoon he states his heart rate was in the 90s and he felt somewhat short of breath.  He did not have chest pain.  He was hydrated in the emergency room with IV fluids and his symptoms improved.  Since that time he is able to ride his mountain bike for 15 miles without having any symptoms of dyspnea or chest pain and has had no further fatigue.  No syncope.  Current Outpatient Medications  Medication Sig Dispense Refill   aspirin  EC 81 MG tablet Take 1 tablet (81 mg total) by mouth daily. Swallow whole.     lansoprazole (PREVACID) 30 MG capsule Take 15 mg by mouth daily.      Pediatric Multivit-Minerals-C (CHEWABLES MULTIVITAMIN PO) Take 1 tablet by mouth daily.     pramoxine (PROCTOFOAM) 1 % foam Place rectally as directed.       rosuvastatin  (CRESTOR ) 40 MG tablet Take 1 tablet (40 mg total) by mouth daily. 90 tablet 3   telmisartan-hydrochlorothiazide (MICARDIS HCT) 40-12.5 MG per tablet Take 1 tablet by mouth daily.       No current facility-administered medications for this visit.    Allergies  Allergen Reactions   Penicillins     REACTION: rash   Propoxyphene Hcl     REACTION: rash-- Darvon      Past Medical History:  Diagnosis  Date   Allergy    Barrett's esophagus    Coronary artery calcification    Diverticulosis    Esophageal stricture    GERD (gastroesophageal reflux disease)    Hiatal hernia    Hyperlipidemia    Hypertension     Past Surgical History:  Procedure Laterality Date   COLONOSCOPY     INGUINAL HERNIA REPAIR     bilateral   osteomylitis in ankle     age 66 with surgery   UPPER GASTROINTESTINAL ENDOSCOPY      Social History   Socioeconomic History   Marital status: Married    Spouse name: Not on file   Number of children: 2   Years of education: Not on file   Highest education level: Not on file  Occupational History   Occupation: Charity fundraiser  Tobacco Use   Smoking status: Never   Smokeless tobacco: Never  Substance and Sexual Activity   Alcohol use: Yes    Comment: one daily   Drug use: No   Sexual activity: Not on file  Other Topics Concern   Not on file  Social History Narrative   Not on file   Social Drivers of Health   Financial Resource Strain: Not on file  Food Insecurity: Not on file  Transportation Needs: Not on file  Physical Activity: Not on file  Stress: Not on file  Social Connections:  Unknown (11/17/2021)   Received from [facility]   Social Network    Social Network: Not on file  Intimate Partner Violence: Unknown (10/09/2021)   Received from [facility]   HITS    Physically Hurt: Not on file    Insult or Talk Down To: Not on file    Threaten Physical Harm: Not on file    Scream or Curse: Not on file    Family History  Problem Relation Age of Onset   Colon cancer Mother        questionable   Alzheimer's disease Mother    Prostate cancer Father    Heart attack Brother    Colon polyps Neg Hx    Rectal cancer Neg Hx    Stomach cancer Neg Hx     ROS: no fevers or chills, productive cough, hemoptysis, dysphasia, odynophagia, melena, hematochezia, dysuria, hematuria, rash, seizure activity, orthopnea, PND, pedal edema, claudication. Remaining  systems are negative.  Physical Exam:   Blood pressure (!) 140/90, pulse (!) 57, height 6' (1.829 m), weight 207 lb (93.9 kg), SpO2 95%.  General:  Well developed/well nourished in NAD Skin warm/dry Patient not depressed No peripheral clubbing Back-normal HEENT-normal/normal eyelids Neck supple/normal carotid upstroke bilaterally; no bruits; no JVD; no thyromegaly chest - CTA/ normal expansion CV - RRR/normal S1 and S2; no murmurs, rubs or gallops;  PMI nondisplaced Abdomen -NT/ND, no HSM, no mass, + bowel sounds, no bruit 2+ femoral pulses, no bruits Ext-no edema, chords, 2+ DP Neuro-grossly nonfocal  ECG -October 08, 2023-sinus tachycardia with no ST changes.  Personally reviewed  A/P  1 elevated troponin-minimal elevation and not clearly diagnostic.  Electrocardiogram showed no ST changes.  He did not have chest pain at the time of his ER evaluation and has had no chest pain with vigorous activity since that time.  We discussed more definitive evaluation including coronary CTA but he would prefer conservative measures at this point.  I think this is reasonable.  Will reconsider if he has more symptoms in the future.  2 coronary calcification-continue aspirin  and statin.  3 hyperlipidemia-continue statin.  4 hypertension-blood pressure mildly elevated but he states he follows this at home and it is typically controlled.  Continue present medications and follow.  5 mild renal insufficiency-noted on recent laboratories possibly secondary to dehydration at the time of his ER visit.  He is scheduled to have labs drawn with his primary care physician in the next week.  Redell Shallow, MD

## 2024-01-10 ENCOUNTER — Encounter: Payer: Self-pay | Admitting: Cardiology

## 2024-01-10 ENCOUNTER — Ambulatory Visit: Attending: Cardiology | Admitting: Cardiology

## 2024-01-10 VITALS — BP 140/90 | HR 57 | Ht 72.0 in | Wt 207.0 lb

## 2024-01-10 DIAGNOSIS — I10 Essential (primary) hypertension: Secondary | ICD-10-CM | POA: Diagnosis not present

## 2024-01-10 DIAGNOSIS — I2510 Atherosclerotic heart disease of native coronary artery without angina pectoris: Secondary | ICD-10-CM | POA: Diagnosis not present

## 2024-01-10 DIAGNOSIS — R7989 Other specified abnormal findings of blood chemistry: Secondary | ICD-10-CM

## 2024-01-10 DIAGNOSIS — E7800 Pure hypercholesterolemia, unspecified: Secondary | ICD-10-CM

## 2024-01-10 NOTE — Patient Instructions (Signed)
  Follow-Up: At [facility], you and your health needs are our priority.  As part of our continuing mission to provide you with exceptional heart care, our providers are all part of one team.  This team includes your primary Cardiologist (physician) and Advanced Practice Providers or APPs (Physician Assistants and Nurse Practitioners) who all work together to provide you with the care you need, when you need it.  Your next appointment:   12 month(s)  Provider:   Alexandria Angel MD

## 2024-04-23 ENCOUNTER — Other Ambulatory Visit: Payer: Self-pay | Admitting: Cardiology

## 2024-04-23 DIAGNOSIS — I2510 Atherosclerotic heart disease of native coronary artery without angina pectoris: Secondary | ICD-10-CM

## 2024-05-05 ENCOUNTER — Other Ambulatory Visit: Payer: Self-pay | Admitting: Cardiology

## 2024-05-05 DIAGNOSIS — I2510 Atherosclerotic heart disease of native coronary artery without angina pectoris: Secondary | ICD-10-CM

## 2024-05-08 ENCOUNTER — Other Ambulatory Visit: Payer: Self-pay | Admitting: Cardiology

## 2024-05-08 DIAGNOSIS — I2510 Atherosclerotic heart disease of native coronary artery without angina pectoris: Secondary | ICD-10-CM

## 2024-05-10 ENCOUNTER — Other Ambulatory Visit: Payer: Self-pay | Admitting: *Deleted

## 2024-05-10 DIAGNOSIS — I2510 Atherosclerotic heart disease of native coronary artery without angina pectoris: Secondary | ICD-10-CM

## 2024-05-10 MED ORDER — ROSUVASTATIN CALCIUM 20 MG PO TABS: 40.0000 mg | ORAL_TABLET | Freq: Every day | ORAL | 3 refills | Status: AC

## 2024-05-10 NOTE — Telephone Encounter (Signed)
 Pt of Dr. Pietro. Does Dr. Pietro agree to this change requested from Pharmacy? Please advise.

## 2024-08-06 ENCOUNTER — Other Ambulatory Visit: Payer: Self-pay | Admitting: Cardiology

## 2024-08-06 DIAGNOSIS — I2510 Atherosclerotic heart disease of native coronary artery without angina pectoris: Secondary | ICD-10-CM

## 2024-08-10 ENCOUNTER — Telehealth: Payer: Self-pay | Admitting: Cardiology

## 2024-08-10 NOTE — Telephone Encounter (Signed)
 Pt c/o medication issue:  1. Name of Medication: Amlodipine 10 MG   2. How are you currently taking this medication (dosage and times per day)? As written   3. Are you having a reaction (difficulty breathing--STAT)? No  4. What is your medication issue? Pt states his PCP started him on this medication but its causing him to get dizzy.
